# Patient Record
Sex: Female | Born: 1990 | Race: White | Hispanic: No | Marital: Single | Smoking: Former smoker
Health system: Southern US, Community
[De-identification: ages and names within clinical notes are randomized; demographics above are authoritative.]

## PROBLEM LIST (undated history)

## (undated) DIAGNOSIS — G43909 Migraine, unspecified, not intractable, without status migrainosus: Secondary | ICD-10-CM

## (undated) HISTORY — PX: OTHER SURGICAL HISTORY: SHX169

## (undated) HISTORY — DX: Migraine, unspecified, not intractable, without status migrainosus: G43.909

---

## 2012-11-30 ENCOUNTER — Other Ambulatory Visit: Payer: Self-pay | Admitting: Family Medicine

## 2012-11-30 DIAGNOSIS — R109 Unspecified abdominal pain: Secondary | ICD-10-CM

## 2012-12-09 ENCOUNTER — Other Ambulatory Visit: Payer: Self-pay

## 2012-12-10 ENCOUNTER — Other Ambulatory Visit: Payer: Self-pay

## 2012-12-10 ENCOUNTER — Ambulatory Visit
Admission: RE | Admit: 2012-12-10 | Discharge: 2012-12-10 | Disposition: A | Discharge status: Home / Self Care | Payer: 59 | Source: Ambulatory Visit | Attending: Family Medicine | Admitting: Family Medicine

## 2012-12-10 DIAGNOSIS — R109 Unspecified abdominal pain: Secondary | ICD-10-CM

## 2013-09-22 ENCOUNTER — Encounter: Payer: Self-pay | Admitting: Neurology

## 2013-09-23 ENCOUNTER — Encounter: Payer: Self-pay | Admitting: Neurology

## 2013-09-23 ENCOUNTER — Ambulatory Visit (INDEPENDENT_AMBULATORY_CARE_PROVIDER_SITE_OTHER): Payer: Managed Care, Other (non HMO) | Admitting: Neurology

## 2013-09-23 ENCOUNTER — Encounter (INDEPENDENT_AMBULATORY_CARE_PROVIDER_SITE_OTHER): Payer: Self-pay

## 2013-09-23 VITALS — BP 104/62 | HR 71 | Ht 67.0 in | Wt 146.0 lb

## 2013-09-23 DIAGNOSIS — G43009 Migraine without aura, not intractable, without status migrainosus: Secondary | ICD-10-CM

## 2013-09-23 MED ORDER — ELETRIPTAN HYDROBROMIDE 40 MG PO TABS: 40.0000 mg | ORAL_TABLET | ORAL | Status: DC | PRN

## 2013-09-23 NOTE — Progress Notes (Signed)
[facility]    Provider:  Dr Hosie Poisson Referring Provider: Catha Gosselin, MD Primary Care Physician:  Mickie Hillier, MD  CC:  migraine  HPI:  Audrey Yates is a 23 y.o. female here as a referral from Dr. Clarene Duke for migraine headaches  Has had history of migraines since childhood. Initially controlled on Imitrex but recently added Topamax due to decreased benefit of Imitrex. Currently occuring 3 to 4 times a week,can last hours to all day, worse the week of her period. Always on the left side, severe throbbing/pounding pain. + photo and phonophobia, + nausea and emesis. Gets blurry vision during the headaches. Start slow and get progressively worse, at worst are a 7/10. No triggers. No difficulty with sleep, getr 6 to 8 hours a night. Limited caffeine intake.   Dr. Clarene Duke recently started her on Topamax, was on 25mg  qhs but stopped after a few days due to paresthesias in her hands. Has not tried any other prophylactic agents.   Review of Systems: Out of a complete 14 system review, the patient complains of only the following symptoms, and all other reviewed systems are negative. + blurred vision, eye pain, fatigue, easy bruising, easy bleeding, headache, numbness  History   Social History  . Marital Status: Married    Spouse Name: N/A    Number of Children: N/A  . Years of Education: N/A   Occupational History  . Health advisor     customer care   Social History Main Topics  . Smoking status: Former Games developer  . Smokeless tobacco: Never Used     Comment: Quit in 2012  . Alcohol Use: No  . Drug Use: No  . Sexual Activity: Not on file   Other Topics Concern  . Not on file   Social History Narrative   Lives with significant other, no children   Right handed   Some college    1 soda daily    Family History  Problem Relation Age of Onset  . Anxiety disorder Mother   . Cancer - Other Maternal Grandmother     Breast Cancer    Past Medical History   Diagnosis Date  . Migraines     No past surgical history on file.  Current Outpatient Prescriptions  Medication Sig Dispense Refill  . pantoprazole (PROTONIX) 20 MG tablet Take 20 mg by mouth daily.      . promethazine (PHENERGAN) 25 MG tablet Take 25 mg by mouth every 6 (six) hours as needed for nausea or vomiting (Take 1/2 to 1 tablet every 6 hours as needed for nausea).      . sucralfate (CARAFATE) 1 G tablet Take 1 g by mouth 4 (four) times daily -  with meals and at bedtime.      . SUMAtriptan (IMITREX) 50 MG tablet Take 50 mg by mouth every 2 (two) hours as needed for migraine or headache. May repeat in 2 hours if headache persists or recurs.      . topiramate (TOPAMAX) 25 MG tablet Take 25 mg by mouth 2 (two) times daily.       No current facility-administered medications for this visit.    Allergies as of 09/23/2013 - Review Complete 09/23/2013  Allergen Reaction Noted  . Estrogens Other (See Comments) 09/22/2013    Vitals: BP 104/62  Pulse 71  Ht 5\' 7"  (1.702 m)  Wt 146 lb (66.225 kg)  BMI 22.86 kg/m2 Last Weight:  Wt Readings from Last 1 Encounters:  09/23/13 146  lb (66.225 kg)   Last Height:   Ht Readings from Last 1 Encounters:  09/23/13 5\' 7"  (1.702 m)     Physical exam: Exam: Gen: NAD, conversant Eyes: anicteric sclerae, moist conjunctivae HENT: Atraumatic, oropharynx clear Neck: Trachea midline; supple,  Lungs: CTA, no wheezing, rales, rhonic                          CV: RRR, no MRG Abdomen: Soft, non-tender;  Extremities: No peripheral edema  Skin: Normal temperature, no rash,  Psych: Appropriate affect, pleasant  Neuro: MS: AA&Ox3, appropriately interactive, normal affect   Speech: fluent w/o paraphasic error  Memory: good recent and remote recall  CN: PERRL, VFF to FC bilat, fundoscopic exam wnl bilat, EOMI no nystagmus, no ptosis, sensation intact to LT V1-V3 bilat, face symmetric, no weakness, hearing grossly intact, palate elevates  symmetrically, shoulder shrug 5/5 bilat,  tongue protrudes midline, no fasiculations noted.  Motor: normal bulk and tone Strength: 5/5  In all extremities  Coord: rapid alternating and point-to-point (FNF, HTS) movements intact.  Reflexes: symmetrical, bilat downgoing toes  Sens: LT intact in all extremities  Gait: posture, stance, stride and arm-swing normal. Tandem gait intact. Able to walk on heels and toes. Romberg absent.   Assessment:  After physical and neurologic examination, review of laboratory studies, imaging, neurophysiology testing and pre-existing records, assessment will be reviewed on the problem list.  Plan:  Treatment plan and additional workup will be reviewed under Problem List.  1)Migraine without aura  22y/o woman presenting for initial evaluation of chronic headaches. Based on description most consistent with a diagnosis of migraine without aura. Has gotten good benefit from Imitrex in the past but benefit currently decreasing. Will try switching to Relpax 40mg  as needed for abortive therapy. Counseled patient on potential side effects of Topamax and that the paresthesias typically will decrease after 1-2 weeks. She will restart 25mg  nightly, if side effects persist after 1-2 weeks will discontinue and try alternative prophylactic therapy. Follow up in 4 months.    Elspeth ChoPeter Kyle Stansell, DO  [facility] [address] [place], [place] [id number]  Phone [phone] Fax [phone]

## 2013-09-23 NOTE — Patient Instructions (Addendum)
Overall you are doing fairly well but I do want to suggest a few things today:   Remember to drink plenty of fluid, eat healthy meals and do not skip any meals. Try to eat protein with a every meal and eat a healthy snack such as fruit or nuts in between meals. Try to keep a regular sleep-wake schedule and try to exercise daily, particularly in the form of walking, 20-30 minutes a day, if you can.   As far as your medications are concerned, I would like to suggest the following: 1)Try taking Topamax 25mg  nightly for one week and see if the side effects improve. If not, please give us a call and we can discuss different options 2)Try taking Relpax instead of the Imitrex. It is taken in the same way, with one tablet at headache onset and an additional tablet at 2 hours if no improvement  Give us a call in 1 month to update us on how you are feeling  I would like to see you back in 4 months, sooner if we need to. Please call us with any interim questions, concerns, problems, updates or refill requests.   My clinical assistant and will answer any of your questions and relay your messages to me and also relay most of my messages to you.   Our phone number is [phone]. We also have an after hours call service for urgent matters and there is a physician on-call for urgent questions. For any emergencies you know to call 911 or go to the nearest emergency room

## 2014-01-27 ENCOUNTER — Ambulatory Visit: Payer: 59 | Admitting: Neurology

## 2014-03-01 ENCOUNTER — Encounter: Payer: Self-pay | Admitting: Neurology

## 2014-03-01 ENCOUNTER — Ambulatory Visit (INDEPENDENT_AMBULATORY_CARE_PROVIDER_SITE_OTHER): Payer: Managed Care, Other (non HMO) | Admitting: Neurology

## 2014-03-01 VITALS — BP 114/65 | HR 75 | Ht 67.0 in | Wt 139.0 lb

## 2014-03-01 DIAGNOSIS — G43009 Migraine without aura, not intractable, without status migrainosus: Secondary | ICD-10-CM

## 2014-03-01 NOTE — Progress Notes (Signed)
[facility]    Provider:  Dr Hosie Poisson Referring Provider: Catha Gosselin, MD Primary Care Physician:  Mickie Hillier, MD  CC:  migraine  HPI:  Audrey Yates is a 23 y.o. female here as a follow up from Dr. Clarene Duke for migraine headaches. Last visit was 08/2013 at which time she was started on topamax  nightly. Headache control has been markedly improved, now occuring <1 per month, typically tied with her period. Using triptan for abortive therapy with good benefit.   Main concern is periods of disorientation, feels very foggy and confused. Occurs randomly throughout the day, is not tied to timing of her dose. Notes difficulty getting words out. No lip smacking or eye blinking, no extremity twitching. Extremity typically lasts around 5 to 10 minutes and then slowly resolves. No recent head trauma. No prior history. These episodes began after she was started on Topamax.   Initial visit 08/2013 Has had history of migraines since childhood. Initially controlled on Imitrex but recently added Topamax due to decreased benefit of Imitrex. Currently occuring 3 to 4 times a week,can last hours to all day, worse the week of her period. Always on the left side, severe throbbing/pounding pain. + photo and phonophobia, + nausea and emesis. Gets blurry vision during the headaches. Start slow and get progressively worse, at worst are a 7/10. No triggers. No difficulty with sleep, getr 6 to 8 hours a night. Limited caffeine intake.   Dr. Clarene Duke recently started her on Topamax, was on  qhs but stopped after a few days due to paresthesias in her hands. Has not tried any other prophylactic agents.   Review of Systems: Out of a complete 14 system review, the patient complains of only the following symptoms, and all other reviewed systems are negative. + fatigue, easy bruising, easy bleeding, headache, numbness  History   Social History  . Marital Status: Single    Spouse Name: N/A      Number of Children: 0  . Years of Education: 12+   Occupational History  . Health advisor     customer care  .     Social History Main Topics  . Smoking status: Former Games developer  . Smokeless tobacco: Never Used     Comment: Quit in 2012  . Alcohol Use: No  . Drug Use: No  . Sexual Activity: Not on file   Other Topics Concern  . Not on file   Social History Narrative   Lives with significant other, no children   Right handed   Some college    1 soda daily    Family History  Problem Relation Age of Onset  . Anxiety disorder Mother   . Cancer - Other Maternal Grandmother     Breast Cancer    Past Medical History  Diagnosis Date  . Migraines     Past Surgical History  Procedure Laterality Date  . None      Current Outpatient Prescriptions  Medication Sig Dispense Refill  . eletriptan (RELPAX) 40 MG tablet Take 1 tablet (40 mg total) by mouth as needed for migraine or headache. One tablet by mouth at onset of headache. May repeat in 2 hours if headache persists or recurs.  10 tablet  3  . pantoprazole (PROTONIX) 20 MG tablet Take 20 mg by mouth daily.      . promethazine (PHENERGAN) 25 MG tablet Take 25 mg by mouth every 6 (six) hours as needed for nausea or vomiting (Take 1/2 to  1 tablet every 6 hours as needed for nausea).      . sucralfate (CARAFATE) 1 G tablet Take 1 g by mouth 4 (four) times daily -  with meals and at bedtime.      . topiramate (TOPAMAX) 25 MG tablet Take 25 mg by mouth 2 (two) times daily.       No current facility-administered medications for this visit.    Allergies as of 03/01/2014 - Review Complete 03/01/2014  Allergen Reaction Noted  . Estrogens Other (See Comments) 09/22/2013    Vitals: BP 114/65  Pulse 75  Ht  (1.702 m)  Wt 139 lb (63.05 kg)  BMI 21.77 kg/m2 Last Weight:  Wt Readings from Last 1 Encounters:  03/01/14 139 lb (63.05 kg)   Last Height:   Ht Readings from Last 1 Encounters:  03/01/14  (1.702 m)      Physical exam: Exam: Gen: NAD, conversant Eyes: anicteric sclerae, moist conjunctivae HENT: Atraumatic, oropharynx clear Neck: Trachea midline; supple,  Lungs: CTA, no wheezing, rales, rhonic                          CV: RRR, no MRG Abdomen: Soft, non-tender;  Extremities: No peripheral edema  Skin: Normal temperature, no rash,  Psych: Appropriate affect, pleasant  Neuro: MS: AA&Ox3, appropriately interactive, normal affect   Speech: fluent w/o paraphasic error  Memory: good recent and remote recall  CN: PERRL, VFF to FC bilat, fundoscopic exam wnl bilat, EOMI no nystagmus, no ptosis, sensation intact to LT V1-V3 bilat, face symmetric, no weakness, hearing grossly intact, palate elevates symmetrically, shoulder shrug 5/5 bilat,  tongue protrudes midline, no fasiculations noted.  Motor: normal bulk and tone Strength: 5/5  In all extremities  Coord: rapid alternating and point-to-point (FNF, HTS) movements intact.  Reflexes: symmetrical, bilat downgoing toes  Sens: LT intact in all extremities  Gait: posture, stance, stride and arm-swing normal. Tandem gait intact. Able to walk on heels and toes. Romberg absent.   Assessment:  After physical and neurologic examination, review of laboratory studies, imaging, neurophysiology testing and pre-existing records, assessment will be reviewed on the problem list.  Plan:  Treatment plan and additional workup will be reviewed under Problem List.  1)Migraine without aura 2)Disorientation  22y/o woman presenting for follow up evaluation of chronic headaches. Based on description most consistent with a diagnosis of migraine without aura. Has gotten good benefit from Topamax with < 1 headache per month. Main concern is episodes of disorientation which may be side effect from Topamax. Discussed options with patient. Will taper off Topamax. If headaches return will restart or try XR Topamax. If periods of confusion do not resolve  then would consider EEG and/or brain imaging. Follow up in 4 months with Dr Artemio Aly.    Elspeth Cho, DO  [facility] [address] [place], [place] [address]  Phone [phone] Fax [phone]

## 2014-03-01 NOTE — Patient Instructions (Signed)
Overall you are doing fairly well but I do want to suggest a few things today:   Remember to drink plenty of fluid, eat healthy meals and do not skip any meals. Try to eat protein with a every meal and eat a healthy snack such as fruit or nuts in between meals. Try to keep a regular sleep-wake schedule and try to exercise daily, particularly in the form of walking, 20-30 minutes a day, if you can.   As far as your medications are concerned, I would like to suggest we try to taper you off of the Topamax. Please decrease to 12.5mg  (1/2 tablet) nightly for 2 weeks and then discontinue. If you headache returns then we can restart the full dose or try extended release Topamax.  Please follow up with Dr Artemio Aly in 4 to 6 months.   Please call us with any interim questions, concerns, problems, updates or refill requests.   My clinical assistant and will answer any of your questions and relay your messages to me and also relay most of my messages to you.   Our phone number is [phone]. We also have an after hours call service for urgent matters and there is a physician on-call for urgent questions. For any emergencies you know to call 911 or go to the nearest emergency room

## 2014-03-23 ENCOUNTER — Other Ambulatory Visit: Payer: Self-pay | Admitting: Family Medicine

## 2014-03-23 ENCOUNTER — Other Ambulatory Visit (HOSPITAL_COMMUNITY)
Admission: RE | Admit: 2014-03-23 | Discharge: 2014-03-23 | Disposition: A | Discharge status: Home / Self Care | Payer: 59 | Source: Ambulatory Visit | Attending: Family Medicine | Admitting: Family Medicine

## 2014-03-23 DIAGNOSIS — Z113 Encounter for screening for infections with a predominantly sexual mode of transmission: Secondary | ICD-10-CM | POA: Insufficient documentation

## 2014-03-23 DIAGNOSIS — Z01419 Encounter for gynecological examination (general) (routine) without abnormal findings: Secondary | ICD-10-CM | POA: Insufficient documentation

## 2014-03-25 LAB — CYTOLOGY - PAP

## 2014-04-19 ENCOUNTER — Telehealth: Payer: Self-pay | Admitting: Neurology

## 2014-04-19 NOTE — Telephone Encounter (Signed)
I contacted ins and provided clinical info.  They will notify patient of outcome once a decision has been made.  I called the patient, got no answer.  Left message.

## 2014-04-19 NOTE — Telephone Encounter (Signed)
Patient stated prior authorization is needed for Rx eletriptan (RELPAX) 40 MG tablet.  Please call and advise.

## 2014-06-20 ENCOUNTER — Encounter: Payer: Self-pay | Admitting: *Deleted

## 2014-07-07 ENCOUNTER — Ambulatory Visit: Payer: Managed Care, Other (non HMO) | Admitting: Neurology

## 2014-07-26 ENCOUNTER — Ambulatory Visit: Payer: Managed Care, Other (non HMO) | Admitting: Neurology

## 2014-08-01 ENCOUNTER — Encounter: Payer: Self-pay | Admitting: Neurology

## 2014-08-01 ENCOUNTER — Ambulatory Visit (INDEPENDENT_AMBULATORY_CARE_PROVIDER_SITE_OTHER): Payer: Managed Care, Other (non HMO) | Admitting: Neurology

## 2014-08-01 VITALS — BP 103/61 | HR 68 | Ht 67.0 in | Wt 142.0 lb

## 2014-08-01 DIAGNOSIS — G43709 Chronic migraine without aura, not intractable, without status migrainosus: Secondary | ICD-10-CM

## 2014-08-01 MED ORDER — SUMATRIPTAN 20 MG/ACT NA SOLN: 20.0000 mg | NASAL | Status: DC | PRN

## 2014-08-01 MED ORDER — NAPROXEN 500 MG PO TABS: 500.0000 mg | ORAL_TABLET | Freq: Two times a day (BID) | ORAL | Status: AC | PRN

## 2014-08-01 NOTE — Patient Instructions (Addendum)
Overall you are doing fairly well but I do want to suggest a few things today:   Remember to drink plenty of fluid, eat healthy meals and do not skip any meals. Try to eat protein with a every meal and eat a healthy snack such as fruit or nuts in between meals. Try to keep a regular sleep-wake schedule and try to exercise daily, particularly in the form of walking, 20-30 minutes a day, if you can.   As far as your medications are concerned, I would like to suggest: Imitrex nasal spray and Naproxen  I would like to see you back in 3-6 months, sooner if we need to. Please call us with any inter[place] questions, concerns, problems, updates or refill requests.   Please also call us for any test results so we can go over those with you on the phone.  My clinical assistant and will answer any of your questions and relay your messages to me and also relay most of my messages to you.   Our phone number is [phone]. We also have an after hours call service for urgent matters and there is a physician on-call for urgent questions. For any emergencies you know to call 911 or go to the nearest emergency room
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
## 2015-01-14 IMAGING — US US ABDOMEN COMPLETE
1 series · 14 of 25 positions shown · non-contrast
Comparison: None.

CLINICAL DATA: Abdominal cramping, vomiting, diarrhea

COMPLETE ABDOMINAL ULTRASOUND

[Series 1: us abdomen complete · 0.22mm/px · 14 of 73 slices shown]
[im 1/73]
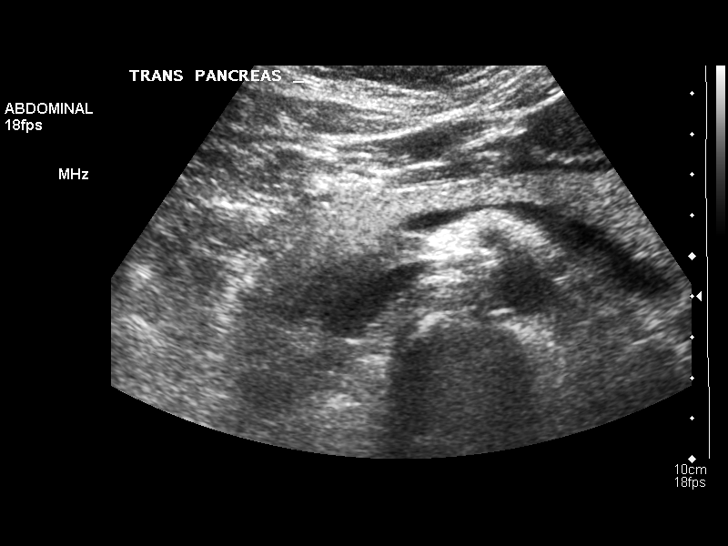
[im 7/73]
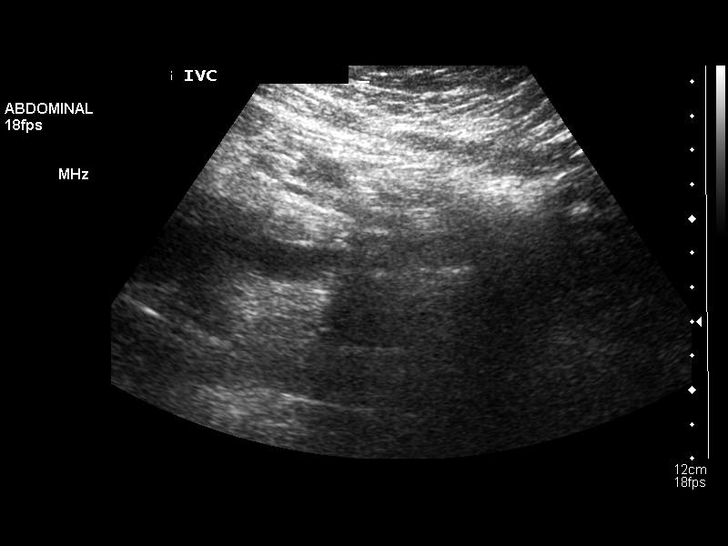
[im 13/73]
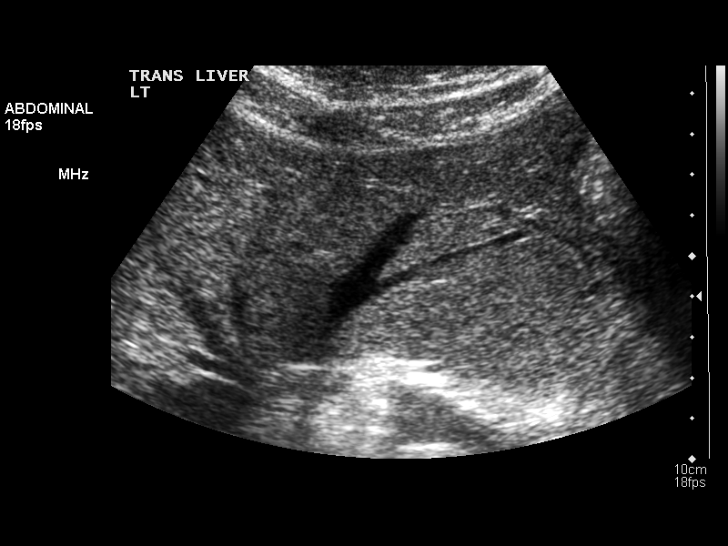
[im 19/73]
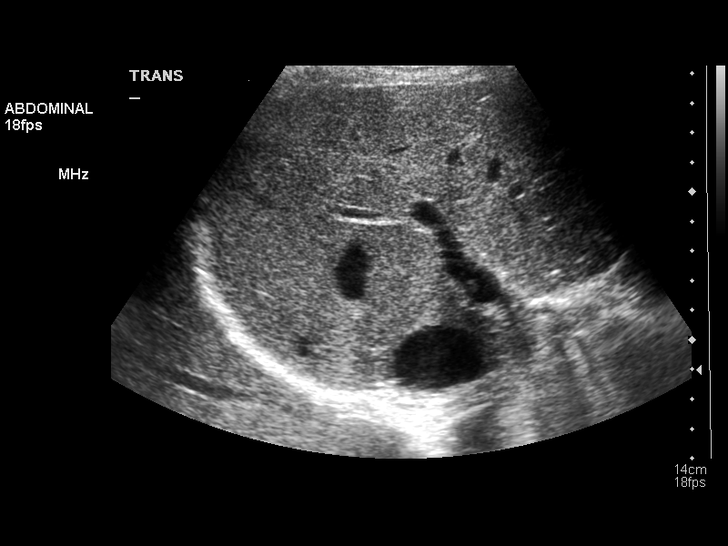
[im 25/73]
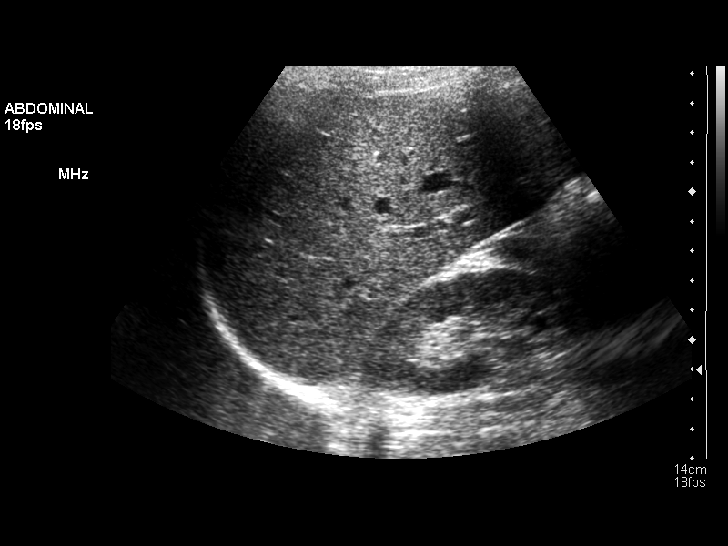
[im 28/73]
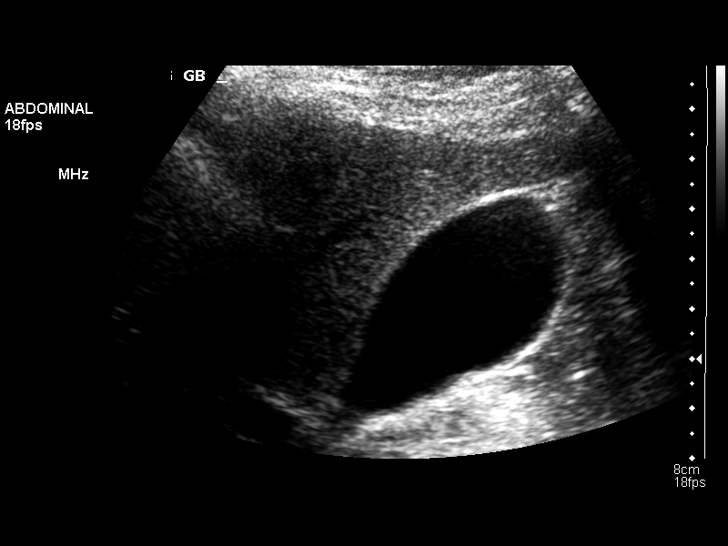
[im 34/73]
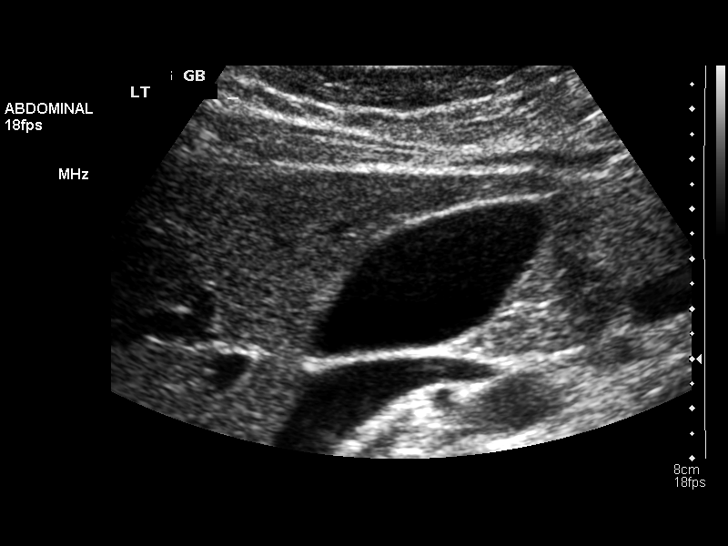
[im 40/73]
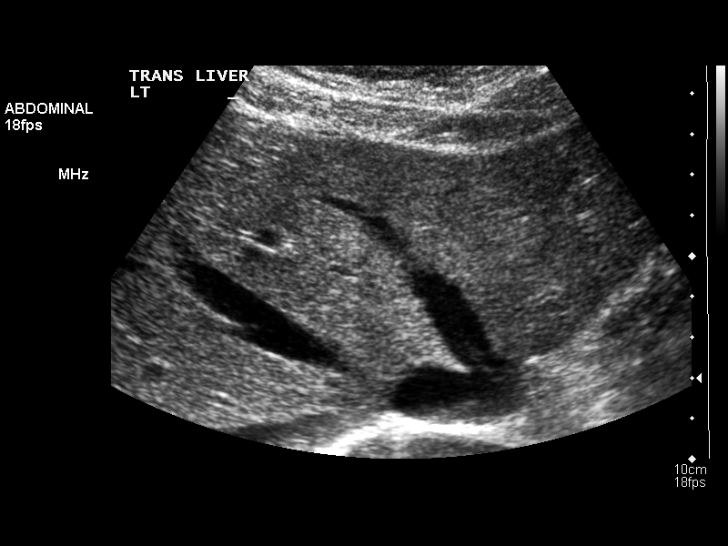
[im 46/73]
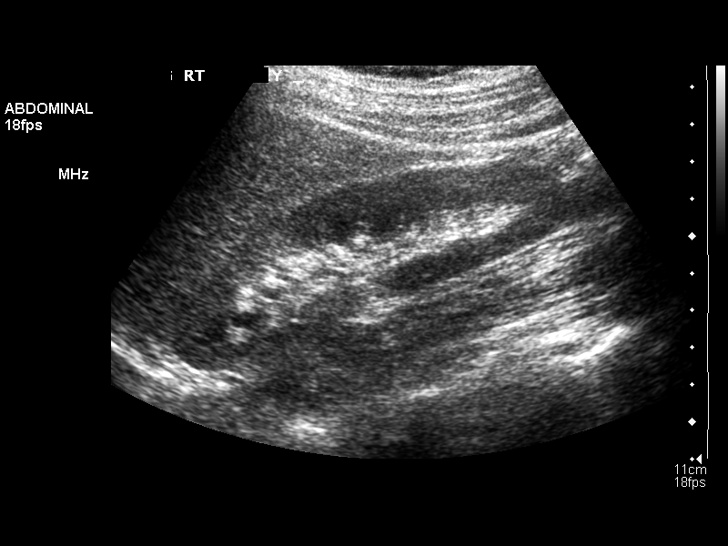
[im 49/73]
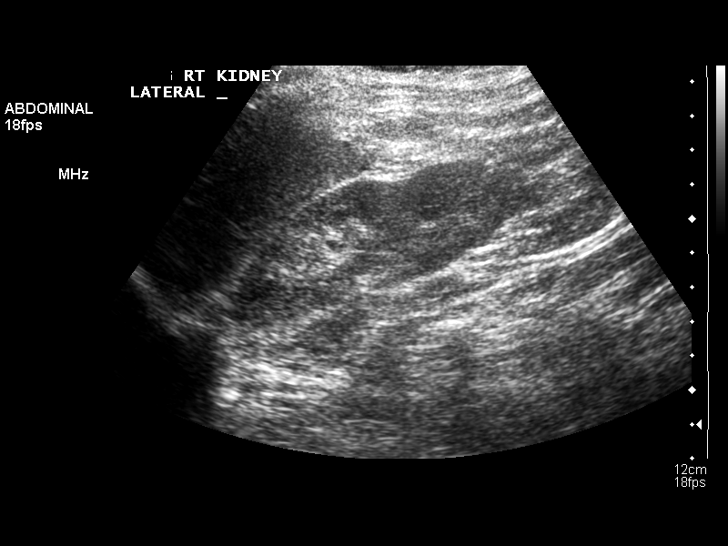
[im 55/73]
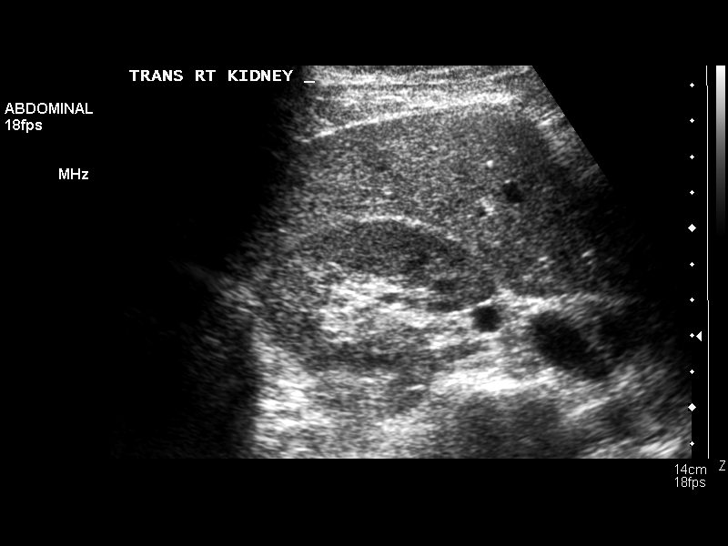
[im 61/73]
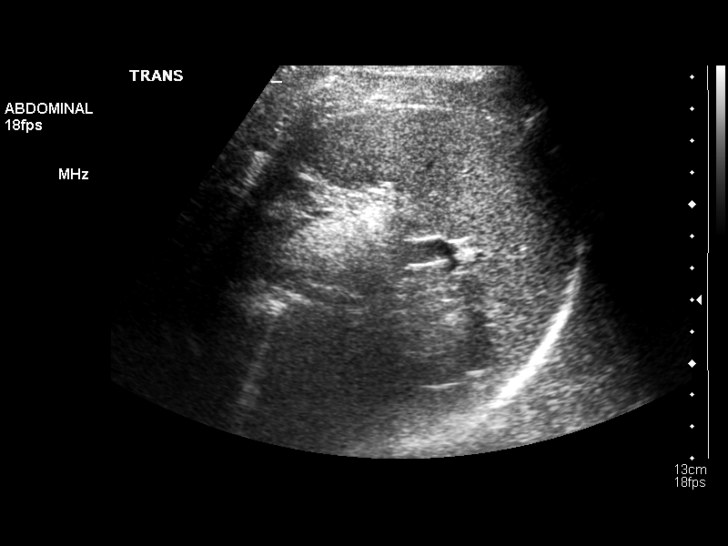
[im 67/73]
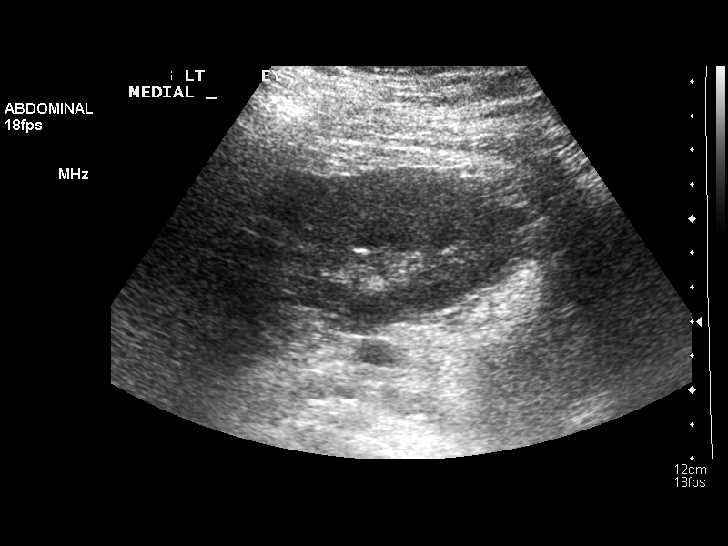
[im 73/73]
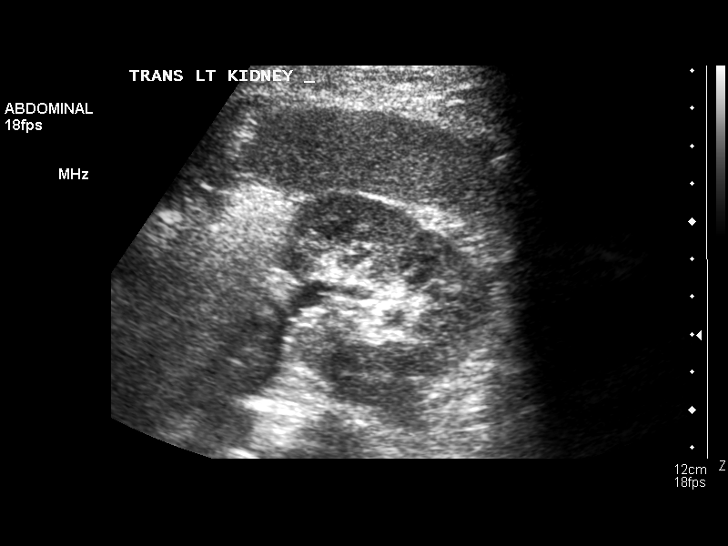

[14 of 25 positions shown; findings below may reference images not displayed]

FINDINGS: Gallbladder:  The gallbladder is visualized and no gallstones are
noted.  There is no pain over the gallbladder with compression.

Common bile duct:  The common bile duct is normal measuring 2.6 mm
in diameter.

Liver:  The liver has a normal echogenic pattern.  No focal
abnormality is seen.

IVC:  Appears normal.

Pancreas:  No focal abnormality seen.

Spleen:  The spleen is normal measuring 9.3 cm sagittally.

Right Kidney:  No hydronephrosis is seen.  The right kidney
measures 11.5 cm sagittally.

Left Kidney:  No hydronephrosis is noted.  The left kidney measures
11.7 cm.

Abdominal aorta:  The abdominal aorta is normal in caliber.
IMPRESSION: Negative abdominal ultrasound.  No gallstones.  No ductal
dilatation.

## 2015-01-23 ENCOUNTER — Ambulatory Visit: Payer: Managed Care, Other (non HMO) | Admitting: Neurology

## 2015-02-07 ENCOUNTER — Other Ambulatory Visit: Payer: Self-pay | Admitting: Neurology

## 2015-02-07 ENCOUNTER — Telehealth: Payer: Self-pay | Admitting: Neurology

## 2015-02-07 DIAGNOSIS — G43009 Migraine without aura, not intractable, without status migrainosus: Secondary | ICD-10-CM

## 2015-02-07 MED ORDER — ELETRIPTAN HYDROBROMIDE 40 MG PO TABS: 40.0000 mg | ORAL_TABLET | ORAL | Status: DC | PRN

## 2015-02-07 NOTE — Telephone Encounter (Signed)
Patient requests written prescription for Relpax

## 2015-02-07 NOTE — Telephone Encounter (Signed)
Kara Mead - can you please print out the prescription for me? I placed the order. thanks

## 2015-02-08 MED ORDER — ELETRIPTAN HYDROBROMIDE 40 MG PO TABS: 40.0000 mg | ORAL_TABLET | ORAL | Status: AC | PRN

## 2015-02-08 NOTE — Telephone Encounter (Signed)
Patient called and requested RX be sent to [facility] in [place].

## 2015-02-08 NOTE — Telephone Encounter (Signed)
Faxed Rx to [facility] in [place] at [phone]. Received fax confirmation.

## 2015-02-08 NOTE — Telephone Encounter (Signed)
Left VM for pt to call back regarding Rx Relpax. Wondering if she wanted it sent right to her pharmacy or if she wants to pick it up in the office. Please ask if she calls back, thank you!

## 2015-10-31 ENCOUNTER — Telehealth: Payer: Self-pay | Admitting: Neurology

## 2015-10-31 NOTE — Telephone Encounter (Signed)
Called pt back. Advised per Dr Lucia GaskinsAhern that she has not been seen in over a year and we cannot prescribe any medications if its been over a year. Pt stated "I cannot afford to come in. I guess I'll juts have to deal". I tried to explain that she can go to her PCP and request this if she has seen her PCP recently. Also, tried to go on to explain if she does get insurance, she can call back and we can get her scheduled. I was going to offer cone assistance options/talk Dana C. For assistance options, but pt hung up on me. Could not go in detail on all this since patient hung up.

## 2015-10-31 NOTE — Telephone Encounter (Signed)
Pt requests refill for sumatriptan tab. Operator advised pt she needs to schedule appt-last seen 2/16, pt sts she does not have insurance and cannot afford to come in for appt. Operator relayed that message would be sent to RN.
